# Patient Record
Sex: Female | Born: 1965 | Race: Black or African American | Hispanic: No | Marital: Single | Smoking: Current some day smoker
Health system: Southern US, Community
[De-identification: ages and names within clinical notes are randomized; demographics above are authoritative.]

## PROBLEM LIST (undated history)

## (undated) DIAGNOSIS — E119 Type 2 diabetes mellitus without complications: Secondary | ICD-10-CM

## (undated) DIAGNOSIS — I10 Essential (primary) hypertension: Secondary | ICD-10-CM

## (undated) DIAGNOSIS — E785 Hyperlipidemia, unspecified: Secondary | ICD-10-CM

## (undated) HISTORY — PX: ABDOMINAL HYSTERECTOMY: SHX81

---

## 2006-07-02 ENCOUNTER — Emergency Department: Payer: Self-pay

## 2006-07-23 ENCOUNTER — Ambulatory Visit: Payer: Self-pay | Admitting: Family Medicine

## 2008-02-02 ENCOUNTER — Ambulatory Visit: Payer: Self-pay | Admitting: Family Medicine

## 2009-02-09 ENCOUNTER — Ambulatory Visit: Payer: Self-pay | Admitting: Family Medicine

## 2010-04-18 ENCOUNTER — Ambulatory Visit: Payer: Self-pay | Admitting: Family Medicine

## 2010-10-25 ENCOUNTER — Emergency Department: Payer: Self-pay | Admitting: *Deleted

## 2011-11-06 ENCOUNTER — Ambulatory Visit: Payer: Self-pay | Admitting: Family Medicine

## 2012-12-20 ENCOUNTER — Emergency Department: Payer: Self-pay | Admitting: Emergency Medicine

## 2012-12-20 LAB — COMPREHENSIVE METABOLIC PANEL
Anion Gap: 6 — ABNORMAL LOW (ref 7–16)
BUN: 14 mg/dL (ref 7–18)
EGFR (African American): >60
SGOT(AST): 21 U/L (ref 15–37)
SGPT (ALT): 24 U/L (ref 12–78)

## 2012-12-20 LAB — CBC
HCT: 38.6 % (ref 35.0–47.0)
MCH: 29.4 pg (ref 26.0–34.0)

## 2012-12-20 LAB — TROPONIN I: Troponin-I: <0.02 ng/mL

## 2013-03-15 ENCOUNTER — Ambulatory Visit: Payer: Self-pay | Admitting: Family Medicine

## 2013-03-26 ENCOUNTER — Ambulatory Visit: Payer: Self-pay | Admitting: Family Medicine

## 2013-06-25 ENCOUNTER — Encounter: Payer: Self-pay | Admitting: Podiatry

## 2013-06-25 ENCOUNTER — Ambulatory Visit (INDEPENDENT_AMBULATORY_CARE_PROVIDER_SITE_OTHER): Payer: 59 | Admitting: Podiatry

## 2013-06-25 VITALS — BP 151/94 | HR 79 | Resp 16 | Ht 65.0 in | Wt 212.0 lb

## 2013-06-25 DIAGNOSIS — M722 Plantar fascial fibromatosis: Secondary | ICD-10-CM

## 2013-06-25 NOTE — Progress Notes (Signed)
Subjective:     Patient ID: Shannon Curtis, female   DOB: 10-Feb-1965, 48 y.o.   MRN: [id number]  HPI patient states my right foot the heel is still hurting me and I admit it's been hurting me since last year but I have not been able to get back and. He ended up doing surgery on the left one in his reminds me exactly of that I would rather just go ahead and get it fixed   Review of Systems     Objective:   Physical Exam Neurovascular status intact with digits well perfused and no significant change in health history. Upon palpation intense discomfort plantar fascia at the insertion of the tendon into the calcaneus is noted    Assessment:     Severe plantar fasciitis of at least one year duration right heel with left but at the same way and ended up requiring correction    Plan:     Reviewed condition and discussed options that we could consider including conservative and surgical patient states she is so tired of the pain she would rather have it fixed and at this time I did allow her to read consent form concerning medial band release of the plantar fascia right and reviewed all possible complications associated with the procedure such as this and the fact that she can develop other problems and just because 1 did so well no guarantee the other one will. Patient understands all of this wants surgery signed consent form and is scheduled for outpatient surgery in the next 2 weeks and at this time had air fracture walker dispensed. Instructed to call with any questions

## 2013-07-06 ENCOUNTER — Encounter: Payer: Self-pay | Admitting: Podiatry

## 2013-07-06 DIAGNOSIS — M722 Plantar fascial fibromatosis: Secondary | ICD-10-CM

## 2013-07-08 ENCOUNTER — Telehealth: Payer: Self-pay | Admitting: *Deleted

## 2013-07-08 NOTE — Progress Notes (Signed)
1. Endoscopic medial band rt heel   Dos 6.30.15

## 2013-07-08 NOTE — Telephone Encounter (Signed)
Left message at home number to call back. 

## 2013-07-12 ENCOUNTER — Encounter: Payer: 59 | Admitting: Podiatry

## 2013-07-15 ENCOUNTER — Ambulatory Visit (INDEPENDENT_AMBULATORY_CARE_PROVIDER_SITE_OTHER): Payer: 59 | Admitting: Podiatry

## 2013-07-15 ENCOUNTER — Encounter: Payer: Self-pay | Admitting: Podiatry

## 2013-07-15 VITALS — BP 139/79 | HR 82 | Resp 16

## 2013-07-15 DIAGNOSIS — M722 Plantar fascial fibromatosis: Secondary | ICD-10-CM

## 2013-07-15 DIAGNOSIS — Z9889 Other specified postprocedural states: Secondary | ICD-10-CM

## 2013-07-15 NOTE — Progress Notes (Signed)
She presents today 2 weeks status post endoscopic plantar fasciotomy right foot. She states that I had no pain whatsoever.  Objective: Vital signs are stable she is alert and oriented x3. Incision sites appear to be well-healed margins are well coapted. Sutures are intact. There is mild erythema mild drainage at the incision site but is it is very superficial.  Assessment: Status post endoscopic plantar fasciotomy right foot.  Plan: Discussed etiology pathology conservative therapies. Remove sutures today. Suggested that she start washing the foot thoroughly and soaking in Epsom salts and water which should dry up a small amount of drainage. She will washes signs and symptoms of infection and notify us that there are any these were explained to her today. She will continue use of the Cam Walker and a night splint and Dr.Regal will followup with her in 2 weeks.

## 2013-07-15 NOTE — Telephone Encounter (Signed)
Patient did not return telephone. Was in the office today to see Dr. Al CorpusHyatt for a post-op follow-up visit.

## 2013-07-20 ENCOUNTER — Other Ambulatory Visit: Payer: 59

## 2013-07-21 ENCOUNTER — Other Ambulatory Visit: Payer: 59

## 2013-07-26 ENCOUNTER — Encounter: Payer: Self-pay | Admitting: Podiatry

## 2013-07-30 ENCOUNTER — Ambulatory Visit (INDEPENDENT_AMBULATORY_CARE_PROVIDER_SITE_OTHER): Payer: 59 | Admitting: Podiatry

## 2013-07-30 ENCOUNTER — Encounter: Payer: Self-pay | Admitting: Podiatry

## 2013-07-30 ENCOUNTER — Ambulatory Visit: Payer: 59 | Admitting: Podiatry

## 2013-07-30 DIAGNOSIS — M722 Plantar fascial fibromatosis: Secondary | ICD-10-CM

## 2013-07-31 NOTE — Progress Notes (Signed)
Subjective:     Patient ID: Shannon Curtis, female   DOB: 17-Jun-1965, 48 y.o.   MRN: [id number]  HPI patient states that she's doing very good with her right heel and would like her stitches removed   Review of Systems     Objective:   Physical Exam Neurovascular status intact with well-healing surgical site right heel medial and lateral side and minimal discomfort in the plantar heel or arch    Assessment:     Improving very well from plantar fascial surgery right    Plan:     Advised on physical therapy of the area remove stitches apply dressings and instructed on continued boot usage with gradual reduction over the next 4 weeks. Reappoint as needed

## 2013-08-02 ENCOUNTER — Encounter: Payer: 59 | Admitting: Podiatry

## 2013-09-07 ENCOUNTER — Encounter: Payer: Self-pay | Admitting: Podiatry

## 2013-09-07 ENCOUNTER — Ambulatory Visit (INDEPENDENT_AMBULATORY_CARE_PROVIDER_SITE_OTHER): Payer: 59

## 2013-09-07 ENCOUNTER — Ambulatory Visit (INDEPENDENT_AMBULATORY_CARE_PROVIDER_SITE_OTHER): Payer: 59 | Admitting: Podiatry

## 2013-09-07 VITALS — BP 148/85 | HR 85 | Resp 16

## 2013-09-07 DIAGNOSIS — R609 Edema, unspecified: Secondary | ICD-10-CM

## 2013-09-07 DIAGNOSIS — M722 Plantar fascial fibromatosis: Secondary | ICD-10-CM

## 2013-09-07 MED ORDER — TRIAMCINOLONE ACETONIDE 10 MG/ML IJ SUSP
10.0000 mg | Freq: Once | INTRAMUSCULAR | Status: AC
  Administered 2013-09-07: 10 mg

## 2013-09-07 NOTE — Progress Notes (Signed)
Subjective:     Patient ID: Shannon Curtis, female   DOB: 10-19-1965, 47 y.o.   MRN: [id number]  HPI patient states that my heel is feeling pretty good but my arch is really hurting me and I have swelling in my ankle and foot right   Review of Systems     Objective:   Physical Exam Patient had endoscopic release of the plantar fascia and is noted to have well-healing incision sites but is noted to have discomfort into the mid arch area right with inflammation in the dorsum of the foot and the right ankle    Assessment:     Plantar inflammation where the tendon may have retracted  with fluid buildup and swelling which may be do to change in gait    Plan:     I did a careful mid arch injection 3 mg dexamethasone Kenalog 5 mg Xylocaine and applied Unna boot Ace wrap surgical shoe to try to reduce all swelling that is present

## 2013-09-21 ENCOUNTER — Encounter: Payer: Self-pay | Admitting: Podiatry

## 2013-09-21 ENCOUNTER — Ambulatory Visit (INDEPENDENT_AMBULATORY_CARE_PROVIDER_SITE_OTHER): Payer: 59 | Admitting: Podiatry

## 2013-09-21 VITALS — BP 127/78 | HR 80 | Resp 16

## 2013-09-21 DIAGNOSIS — M722 Plantar fascial fibromatosis: Secondary | ICD-10-CM

## 2013-09-21 MED ORDER — TRIAMCINOLONE ACETONIDE 10 MG/ML IJ SUSP
10.0000 mg | Freq: Once | INTRAMUSCULAR | Status: AC
  Administered 2013-09-21: 10 mg

## 2013-09-21 NOTE — Progress Notes (Signed)
Subjective:     Patient ID: Shannon Curtis, female   DOB: 11/24/1965, 48 y.o.   MRN: [id number]  HPI patient states that she still has a lot of pain when she tries to walk on her heel but when she's [name] down or sitting she has no pain   Review of Systems     Objective:   Physical Exam Neurovascular status intact negative Homans sign was noted with stitches intact on both medial and lateral side of the right heel    Assessment:     Incision site healing well with wound edges well coapted stitches in place right heel    Plan:     Stitches removed wound edges coapted well and discussed that it can still be quite tender 3 weeks and that her history of taking pain medication and obesity certainly a complicating factors and the pain she is experiencing. Continue boot usage and I did write her for Percocet today to try to control the pain she is experiencing during this short-term process and she will reappoint 2 weeks earlier if any issues should occur

## 2013-10-08 ENCOUNTER — Ambulatory Visit: Payer: 59 | Admitting: Podiatry

## 2013-10-15 ENCOUNTER — Ambulatory Visit: Payer: 59 | Admitting: Podiatry

## 2013-11-19 ENCOUNTER — Ambulatory Visit (INDEPENDENT_AMBULATORY_CARE_PROVIDER_SITE_OTHER): Payer: 59 | Admitting: Podiatry

## 2013-11-19 VITALS — BP 128/69 | HR 74 | Resp 16

## 2013-11-19 DIAGNOSIS — M722 Plantar fascial fibromatosis: Secondary | ICD-10-CM

## 2013-11-21 NOTE — Progress Notes (Signed)
Subjective:     Patient ID: Shannon Curtis, female   DOB: 1965/10/08, 48 y.o.   MRN: [id number]  HPIpatient states that my heel is feeling a lot better with minimal discomfort with ambulation or when getting up in the morning   Review of Systems     Objective:   Physical Exam Neurovascular status intact with significant diminishment of discomfort plantar heel at the insertion of the tendon into the calcaneus    Assessment:     Reviewed condition and at this time advised on the fact the plantar fasciitis is still present but reduced    Plan:     Reviewed physical therapy continued supportive shoe gear usage not going barefoot and reappoint for us to recheck if symptoms were to recur

## 2014-09-07 ENCOUNTER — Other Ambulatory Visit: Payer: Self-pay | Admitting: Family Medicine

## 2014-09-07 DIAGNOSIS — N63 Unspecified lump in unspecified breast: Secondary | ICD-10-CM

## 2014-09-21 ENCOUNTER — Other Ambulatory Visit: Payer: Self-pay

## 2014-09-21 ENCOUNTER — Ambulatory Visit: Payer: Self-pay

## 2014-09-26 ENCOUNTER — Other Ambulatory Visit: Payer: Self-pay | Admitting: Podiatry

## 2015-09-07 ENCOUNTER — Other Ambulatory Visit: Payer: Self-pay | Admitting: Family Medicine

## 2015-09-07 DIAGNOSIS — N63 Unspecified lump in unspecified breast: Secondary | ICD-10-CM

## 2015-10-04 ENCOUNTER — Other Ambulatory Visit: Payer: Self-pay | Admitting: Family Medicine

## 2015-10-04 ENCOUNTER — Encounter (HOSPITAL_COMMUNITY): Payer: Self-pay

## 2015-10-04 ENCOUNTER — Ambulatory Visit
Admission: RE | Admit: 2015-10-04 | Discharge: 2015-10-04 | Disposition: A | Discharge status: Home / Self Care | Payer: 59 | Source: Ambulatory Visit | Attending: Family Medicine | Admitting: Family Medicine

## 2015-10-04 DIAGNOSIS — N63 Unspecified lump in unspecified breast: Secondary | ICD-10-CM

## 2016-02-02 ENCOUNTER — Observation Stay
Admission: EM | Admit: 2016-02-02 | Discharge: 2016-02-02 | Disposition: A | Discharge status: Home / Self Care | Payer: 59 | Attending: Internal Medicine | Admitting: Internal Medicine

## 2016-02-02 ENCOUNTER — Encounter: Payer: Self-pay | Admitting: Urgent Care

## 2016-02-02 ENCOUNTER — Emergency Department: Payer: 59

## 2016-02-02 ENCOUNTER — Observation Stay: Payer: 59

## 2016-02-02 DIAGNOSIS — E119 Type 2 diabetes mellitus without complications: Secondary | ICD-10-CM | POA: Diagnosis not present

## 2016-02-02 DIAGNOSIS — I200 Unstable angina: Secondary | ICD-10-CM | POA: Diagnosis present

## 2016-02-02 DIAGNOSIS — Z7982 Long term (current) use of aspirin: Secondary | ICD-10-CM | POA: Insufficient documentation

## 2016-02-02 DIAGNOSIS — E785 Hyperlipidemia, unspecified: Secondary | ICD-10-CM | POA: Diagnosis not present

## 2016-02-02 DIAGNOSIS — R0789 Other chest pain: Principal | ICD-10-CM | POA: Insufficient documentation

## 2016-02-02 DIAGNOSIS — F17210 Nicotine dependence, cigarettes, uncomplicated: Secondary | ICD-10-CM | POA: Diagnosis not present

## 2016-02-02 DIAGNOSIS — R079 Chest pain, unspecified: Secondary | ICD-10-CM | POA: Diagnosis present

## 2016-02-02 DIAGNOSIS — I10 Essential (primary) hypertension: Secondary | ICD-10-CM | POA: Insufficient documentation

## 2016-02-02 DIAGNOSIS — Z794 Long term (current) use of insulin: Secondary | ICD-10-CM | POA: Diagnosis not present

## 2016-02-02 HISTORY — DX: Type 2 diabetes mellitus without complications: E11.9

## 2016-02-02 HISTORY — DX: Hyperlipidemia, unspecified: E78.5

## 2016-02-02 HISTORY — DX: Essential (primary) hypertension: I10

## 2016-02-02 LAB — NM MYOCAR MULTI W/SPECT W/WALL MOTION / EF
CHL CUP NUCLEAR SRS: 0
CHL CUP NUCLEAR SSS: 3
CHL CUP RESTING HR STRESS: 63 {beats}/min
CHL CUP STRESS STAGE 1 GRADE: 0 %
CHL CUP STRESS STAGE 1 HR: 59 {beats}/min
CHL CUP STRESS STAGE 2 GRADE: 0 %
CHL CUP STRESS STAGE 2 HR: 59 {beats}/min
CHL CUP STRESS STAGE 3 GRADE: 0 %
CHL CUP STRESS STAGE 4 GRADE: 0 %
CHL CUP STRESS STAGE 4 HR: 87 {beats}/min
CHL CUP STRESS STAGE 5 DBP: 61 mmHg
CHL CUP STRESS STAGE 5 SPEED: 0 mph
CSEPED: 1 min
CSEPEDS: 1 s
CSEPEW: 1 METS
CSEPHR: 51 %
CSEPPHR: 65 {beats}/min
CSEPPMHR: 38 %
LV dias vol: 70 mL (ref 46–106)
LVSYSVOL: 23 mL
MPHR: 171 {beats}/min
NUC STRESS TID: 1.41
SDS: 0
Stage 1 Speed: 0 mph
Stage 2 Speed: 0 mph
Stage 3 HR: 65 {beats}/min
Stage 3 Speed: 0 mph
Stage 4 Speed: 0 mph
Stage 5 Grade: 0 %
Stage 5 HR: 75 {beats}/min
Stage 5 SBP: 134 mmHg

## 2016-02-02 LAB — BASIC METABOLIC PANEL
Anion gap: 10 (ref 5–15)
BUN: 14 mg/dL (ref 6–20)
CALCIUM: 9.3 mg/dL (ref 8.9–10.3)
CO2: 25 mmol/L (ref 22–32)
CREATININE: 0.67 mg/dL (ref 0.44–1.00)
Chloride: 103 mmol/L (ref 101–111)
GFR calc non Af Amer: >60 mL/min (ref >60)
GLUCOSE: 257 mg/dL — AB (ref 65–99)
Potassium: 4 mmol/L (ref 3.5–5.1)
Sodium: 138 mmol/L (ref 135–145)

## 2016-02-02 LAB — CBC
HCT: 41.6 % (ref 35.0–47.0)
Hemoglobin: 14.5 g/dL (ref 12.0–16.0)
MCH: 29.4 pg (ref 26.0–34.0)
MCHC: 34.9 g/dL (ref 32.0–36.0)
MCV: 84.3 fL (ref 80.0–100.0)
PLATELETS: 222 10*3/uL (ref 150–440)
RBC: 4.94 MIL/uL (ref 3.80–5.20)
RDW: 12.6 % (ref 11.5–14.5)
WBC: 7.1 10*3/uL (ref 3.6–11.0)

## 2016-02-02 LAB — GLUCOSE, CAPILLARY
Glucose-Capillary: 236 mg/dL — ABNORMAL HIGH (ref 65–99)
Glucose-Capillary: 128 mg/dL — ABNORMAL HIGH (ref 65–99)
Glucose-Capillary: 120 mg/dL — ABNORMAL HIGH (ref 65–99)

## 2016-02-02 LAB — TROPONIN I
Troponin I: <0.03 ng/mL (ref <0.03)
Troponin I: <0.03 ng/mL (ref <0.03)

## 2016-02-02 LAB — TSH: TSH: 1.97 u[IU]/mL (ref 0.350–4.500)

## 2016-02-02 MED ORDER — PANTOPRAZOLE SODIUM 40 MG PO TBEC
40.0000 mg | DELAYED_RELEASE_TABLET | Freq: Every day | ORAL | Status: DC
  Administered 2016-02-02: 40 mg via ORAL
  Filled 2016-02-02: qty 1

## 2016-02-02 MED ORDER — ONDANSETRON HCL 4 MG PO TABS: 4.0000 mg | ORAL_TABLET | Freq: Four times a day (QID) | ORAL | Status: DC | PRN

## 2016-02-02 MED ORDER — ONDANSETRON HCL 4 MG/2ML IJ SOLN
4.0000 mg | Freq: Four times a day (QID) | INTRAMUSCULAR | Status: DC | PRN
  Administered 2016-02-02: 4 mg via INTRAVENOUS

## 2016-02-02 MED ORDER — INSULIN ASPART 100 UNIT/ML ~~LOC~~ SOLN: 0.0000 [IU] | Freq: Every day | SUBCUTANEOUS | Status: DC

## 2016-02-02 MED ORDER — DOCUSATE SODIUM 100 MG PO CAPS
100.0000 mg | ORAL_CAPSULE | Freq: Two times a day (BID) | ORAL | Status: DC
  Administered 2016-02-02: 100 mg via ORAL
  Filled 2016-02-02: qty 1

## 2016-02-02 MED ORDER — REGADENOSON 0.4 MG/5ML IV SOLN
0.4000 mg | Freq: Once | INTRAVENOUS | Status: AC
  Administered 2016-02-02: 0.4 mg via INTRAVENOUS
  Filled 2016-02-02: qty 5

## 2016-02-02 MED ORDER — INSULIN ASPART 100 UNIT/ML ~~LOC~~ SOLN
0.0000 [IU] | Freq: Four times a day (QID) | SUBCUTANEOUS | Status: DC
  Administered 2016-02-02: 5 [IU] via SUBCUTANEOUS
  Filled 2016-02-02: qty 5

## 2016-02-02 MED ORDER — LISINOPRIL 20 MG PO TABS
40.0000 mg | ORAL_TABLET | Freq: Every day | ORAL | Status: DC
  Administered 2016-02-02: 40 mg via ORAL
  Filled 2016-02-02: qty 2

## 2016-02-02 MED ORDER — ACETAMINOPHEN 650 MG RE SUPP: 650.0000 mg | Freq: Four times a day (QID) | RECTAL | Status: DC | PRN

## 2016-02-02 MED ORDER — ROSUVASTATIN CALCIUM 20 MG PO TABS
20.0000 mg | ORAL_TABLET | Freq: Every day | ORAL | Status: DC
  Administered 2016-02-02: 20 mg via ORAL
  Filled 2016-02-02: qty 1

## 2016-02-02 MED ORDER — ASPIRIN 81 MG PO CHEW
324.0000 mg | CHEWABLE_TABLET | Freq: Once | ORAL | Status: AC
  Administered 2016-02-02: 324 mg via ORAL
  Filled 2016-02-02: qty 4

## 2016-02-02 MED ORDER — ONDANSETRON HCL 4 MG/2ML IJ SOLN
INTRAMUSCULAR | Status: AC
  Filled 2016-02-02: qty 2

## 2016-02-02 MED ORDER — NITROGLYCERIN 2 % TD OINT
0.5000 [in_us] | TOPICAL_OINTMENT | Freq: Once | TRANSDERMAL | Status: AC
  Administered 2016-02-02: 0.5 [in_us] via TOPICAL
  Filled 2016-02-02: qty 1

## 2016-02-02 MED ORDER — TECHNETIUM TC 99M TETROFOSMIN IV KIT
31.8700 | PACK | Freq: Once | INTRAVENOUS | Status: AC | PRN
  Administered 2016-02-02: 31.87 via INTRAVENOUS

## 2016-02-02 MED ORDER — ENOXAPARIN SODIUM 40 MG/0.4ML ~~LOC~~ SOLN: 40.0000 mg | SUBCUTANEOUS | Status: DC

## 2016-02-02 MED ORDER — ACETAMINOPHEN 325 MG PO TABS
650.0000 mg | ORAL_TABLET | Freq: Four times a day (QID) | ORAL | Status: DC | PRN
  Administered 2016-02-02: 650 mg via ORAL
  Filled 2016-02-02: qty 2

## 2016-02-02 MED ORDER — SODIUM CHLORIDE 0.9% FLUSH
3.0000 mL | Freq: Two times a day (BID) | INTRAVENOUS | Status: DC
  Administered 2016-02-02: 3 mL via INTRAVENOUS

## 2016-02-02 MED ORDER — TECHNETIUM TC 99M TETROFOSMIN IV KIT
12.7200 | PACK | Freq: Once | INTRAVENOUS | Status: AC | PRN
  Administered 2016-02-02: 12.72 via INTRAVENOUS

## 2016-02-02 MED ORDER — ASPIRIN EC 81 MG PO TBEC
81.0000 mg | DELAYED_RELEASE_TABLET | Freq: Every day | ORAL | Status: DC
  Administered 2016-02-02: 81 mg via ORAL
  Filled 2016-02-02: qty 1

## 2016-02-02 NOTE — H&P (Signed)
Shannon Curtis is an 51 y.o. female.   Chief Complaint: Chest pain HPI: The patient with past medical history of diabetes, hypertension and hyperlipidemia presents to emergency department complaining of chest pain. The patient's pain awoke her from sleep this evening. It was substernal and radiated down her right arm. She works third shift and proceeded to go to work but her pain worsened. The pain was associated with nausea as well as diaphoresis. It is dull in character and 8 out of 10 in severity. Following application of Nitropaste her pain decreased to 3 out of 10 in severity. Due to her risk factors for heart disease and ongoing chest pain the emergency department staff called the hospitalist service for admission.  Past Medical History:  Diagnosis Date  . Diabetes mellitus without complication ([place])   . Hyperlipemia   . Hypertension     Past Surgical History:  Procedure Laterality Date  . ABDOMINAL HYSTERECTOMY    . CESAREAN SECTION      Family History  Problem Relation Age of Onset  . Breast cancer Neg Hx    Social History:  reports that she has been smoking.  She has never used smokeless tobacco. She reports that she does not drink alcohol. Her drug history is not on file.  Allergies:  Allergies  Allergen Reactions  . Atorvastatin Other (See Comments)  . Statins Other (See Comments)    Leg cramps    Medications Prior to Admission  Medication Sig Dispense Refill  . aspirin EC 81 MG tablet Take 81 mg by mouth daily.    Marland Kitchen glimepiride (AMARYL) 4 MG tablet Take 4 mg by mouth 2 (two) times daily.     . Liraglutide (VICTOZA [place]) Inject 1.8 mg into the skin daily.     Marland Kitchen lisinopril (PRINIVIL,ZESTRIL) 40 MG tablet Take 40 mg by mouth daily.     Marland Kitchen omeprazole (PRILOSEC) 20 MG capsule Take 20 mg by mouth daily.    . rosuvastatin (CRESTOR) 20 MG tablet Take 20 mg by mouth daily.      Results for orders placed or performed during the hospital encounter of 02/02/16 (from the past 48  hour(s))  Basic metabolic panel     Status: Abnormal   Collection Time: 02/02/16  1:59 AM  Result Value Ref Range   Sodium 138 135 - 145 mmol/L   Potassium 4.0 3.5 - 5.1 mmol/L   Chloride 103 101 - 111 mmol/L   CO2 25 22 - 32 mmol/L   Glucose, Bld 257 (H) 65 - 99 mg/dL   BUN 14 6 - 20 mg/dL   Creatinine, Ser 0.67 0.44 - 1.00 mg/dL   Calcium 9.3 8.9 - 10.3 mg/dL   GFR calc non Af Amer >60 >60 mL/min   GFR calc Af Amer >60 >60 mL/min    Comment: (NOTE) The eGFR has been calculated using the CKD EPI equation. This calculation has not been validated in all clinical situations. eGFR's persistently <60 mL/min signify possible Chronic Kidney Disease.    Anion gap 10 5 - 15  CBC     Status: None   Collection Time: 02/02/16  1:59 AM  Result Value Ref Range   WBC 7.1 3.6 - 11.0 K/uL   RBC 4.94 3.80 - 5.20 MIL/uL   Hemoglobin 14.5 12.0 - 16.0 g/dL   HCT 41.6 35.0 - 47.0 %   MCV 84.3 80.0 - 100.0 fL   MCH 29.4 26.0 - 34.0 pg   MCHC 34.9 32.0 - 36.0 g/dL  RDW 12.6 11.5 - 14.5 %   Platelets 222 150 - 440 K/uL  Troponin I     Status: None   Collection Time: 02/02/16  1:59 AM  Result Value Ref Range   Troponin I <0.03 <0.03 ng/mL  Glucose, capillary     Status: Abnormal   Collection Time: 02/02/16  5:37 AM  Result Value Ref Range   Glucose-Capillary 236 (H) 65 - 99 mg/dL   Comment 1 Notify RN    Comment 2 Document in Chart   TSH     Status: None   Collection Time: 02/02/16  5:46 AM  Result Value Ref Range   TSH 1.970 0.350 - 4.500 uIU/mL    Comment: Performed by a 3rd Generation assay with a functional sensitivity of <=0.01 uIU/mL.  Troponin I     Status: None   Collection Time: 02/02/16  5:46 AM  Result Value Ref Range   Troponin I <0.03 <0.03 ng/mL  Glucose, capillary     Status: Abnormal   Collection Time: 02/02/16  7:53 AM  Result Value Ref Range   Glucose-Capillary 128 (H) 65 - 99 mg/dL   Dg Chest 2 View  Result Date: 02/02/2016 CLINICAL DATA:  Initial evaluation for  acute chest pain, shortness of breath. EXAM: CHEST  2 VIEW COMPARISON:  Prior radiograph from 07/02/2006. FINDINGS: The cardiac and mediastinal silhouettes are stable in size and contour, and remain within normal limits. The lungs are normally inflated. No airspace consolidation, pleural effusion, or pulmonary edema is identified. There is no pneumothorax. No acute osseous abnormality identified. IMPRESSION: No active cardiopulmonary disease. Electronically Signed   By: Jeannine Boga M.D.   On: 02/02/2016 02:32    Review of Systems  Constitutional: Positive for diaphoresis. Negative for chills and fever.  HENT: Negative for sore throat and tinnitus.   Eyes: Negative for blurred vision and redness.  Respiratory: Positive for shortness of breath. Negative for cough.   Cardiovascular: Positive for chest pain. Negative for palpitations, orthopnea and PND.  Gastrointestinal: Positive for nausea. Negative for abdominal pain, diarrhea and vomiting.  Genitourinary: Negative for dysuria, frequency and urgency.  Musculoskeletal: Negative for joint pain and myalgias.  Skin: Negative for rash.       No lesions  Neurological: Negative for speech change, focal weakness and weakness.  Endo/Heme/Allergies: Does not bruise/bleed easily.       No temperature intolerance  Psychiatric/Behavioral: Negative for depression and suicidal ideas.    Blood pressure 120/66, pulse 74, temperature 98.5 F (36.9 C), temperature source Oral, resp. rate 18, height '5\' 4"'  (1.626 m), weight 86.8 kg (191 lb 4.8 oz), SpO2 99 %. Physical Exam  Vitals reviewed. Constitutional: She is oriented to person, place, and time. She appears well-developed and well-nourished.  HENT:  Head: Normocephalic and atraumatic.  Mouth/Throat: Oropharynx is clear and moist.  Eyes: Conjunctivae and EOM are normal. Pupils are equal, round, and reactive to light. No scleral icterus.  Neck: Normal range of motion. Neck supple. No tracheal  deviation present. No thyromegaly present.  Cardiovascular: Normal rate, regular rhythm and normal heart sounds.  Exam reveals no gallop and no friction rub.   No murmur heard. Respiratory: Effort normal and breath sounds normal.  GI: Soft. Bowel sounds are normal. She exhibits no distension. There is no tenderness.  Genitourinary:  Genitourinary Comments: Deferred  Musculoskeletal: Normal range of motion. She exhibits no edema.  Lymphadenopathy:    She has no cervical adenopathy.  Neurological: She is alert and oriented to  person, place, and time. No cranial nerve deficit. She exhibits normal muscle tone.  Skin: Skin is warm and dry. No rash noted. No erythema.  Psychiatric: She has a normal mood and affect. Her behavior is normal. Judgment and thought content normal.     Assessment/Plan This is a 51 year old female admitted for chest pain. 1. Chest pain: Atypical induration as well as symptomology, however the patient has diabetes which can alter the presentation of ischemic chest pain. Currently troponin and EKG showed no signs or symptoms of myocardial ischemia. Follow cardiac biomarkers and monitor telemetry. Consult cardiology. 2. Diabetes mellitus type 2: Hold Victoza and oral hypoglycemic agents; sliding scale insulin while hospitalized 3. Essential hypertension: Controlled; Continue lisinopril 4. Hyperlipidemia: Continue statin therapy 5. DVT prophylaxis: Lovenox 6. GI prophylaxis: Pantoprazole per home regimen The patient is a full code. Time spent on admission orders and patient care approximately 45 minutes  Harrie Foreman, MD 02/02/2016, 8:11 AM

## 2016-02-02 NOTE — ED Triage Notes (Signed)
Patient presents with c/o RIGHT sided CP with (+) radiation into RUE. She reports that her arm is "heavy". (+) nausea and SOB; denies diaphoresis.

## 2016-02-02 NOTE — Progress Notes (Signed)
Patient arrived to 2A Room 259. Patient denies pain and all questions answered. Patient oriented to unit and Fall Safety Plan signed. Skin assessment completed with Crystal RN and skin intact. A&Ox4, VSS, and NSR on verified tele-box #[id number]. Nursing staff will continue to monitor for any changes in patient status. Lamonte RicherKara A Linzy Darling, RN

## 2016-02-02 NOTE — ED Notes (Signed)
ED Provider at bedside. 

## 2016-02-02 NOTE — ED Provider Notes (Signed)
[facility] Emergency Department Provider Note   ____________________________________________   First MD Initiated Contact with Patient 02/02/16 [phone]     (approximate)  I have reviewed the triage vital signs and the nursing notes.   HISTORY  Chief Complaint Chest Pain and Arm Pain    HPI Shannon Curtis is a 51 y.o. female who presents to the ED from work with a chief complaint of chest pain. Patient has a history of diabetes and hypertension who works third shift; awoke approximately 7:30 PM with right sided chest heaviness radiating into her right arm.While at work at [employer], patient had an intense episode of chest heaviness associated with diaphoresis, nausea and shortness of breath. Describes waxing/waning chest pressure. Denies recent fever, chills, abdominal pain, vomiting, diarrhea. Denies recent travel or trauma. Nothing makes her symptoms better or worse.   Past Medical History:  Diagnosis Date  . Diabetes mellitus without complication (HCC)   . Hyperlipemia   . Hypertension     There are no active problems to display for this patient.   Past Surgical History:  Procedure Laterality Date  . ABDOMINAL HYSTERECTOMY    . CESAREAN SECTION      Prior to Admission medications   Medication Sig Start Date End Date Taking? Authorizing Provider  glimepiride (AMARYL) 4 MG tablet Take 4 mg by mouth 2 (two) times daily.    Yes Historical Provider, MD  Liraglutide (VICTOZA [place]) Inject 1.8 mg into the skin daily.    Yes Historical Provider, MD  lisinopril (PRINIVIL,ZESTRIL) 40 MG tablet Take 40 mg by mouth daily.    Yes Historical Provider, MD  omeprazole (PRILOSEC) 20 MG capsule Take 20 mg by mouth daily.   Yes Historical Provider, MD  rosuvastatin (CRESTOR) 20 MG tablet Take 20 mg by mouth daily.   Yes Historical Provider, MD    Allergies Atorvastatin and Statins  Family History  Problem Relation Age of Onset  . Breast cancer Neg Hx      Social History Social History  Substance Use Topics  . Smoking status: Current Some Day Smoker  . Smokeless tobacco: Never Used  . Alcohol use No    Review of Systems  Constitutional: No fever/chills. Eyes: No visual changes. ENT: No sore throat. Cardiovascular: Positive for chest pain. Respiratory: Positive for shortness of breath. Gastrointestinal: No abdominal pain.  Positive for nausea, no vomiting.  No diarrhea.  No constipation. Genitourinary: Negative for dysuria. Musculoskeletal: Negative for back pain. Skin: Negative for rash. Neurological: Negative for headaches, focal weakness or numbness.  10-point ROS otherwise negative.  ____________________________________________   PHYSICAL EXAM:  VITAL SIGNS: ED Triage Vitals [02/02/16 0158]  Enc Vitals Group     BP (!) 169/97     Pulse Rate 91     Resp 18     Temp 98.1 F (36.7 C)     Temp Source Oral     SpO2 97 %     Weight 197 lb (89.4 kg)     Height 5\' 4"  (1.626 m)     Head Circumference      Peak Flow      Pain Score 7     Pain Loc      Pain Edu?      Excl. in GC?      Constitutional: Alert and oriented. Well appearing and in no acute distress. Eyes: Conjunctivae are normal. PERRL. EOMI. Head: Atraumatic. Nose: No congestion/rhinnorhea. Mouth/Throat: Mucous membranes are moist.  Oropharynx non-erythematous. Neck: No  stridor.   Cardiovascular: Normal rate, regular rhythm. Grossly normal heart sounds.  Good peripheral circulation. Respiratory: Normal respiratory effort.  No retractions. Lungs CTAB. Gastrointestinal: Soft and nontender. No distention. No abdominal bruits. No CVA tenderness. Musculoskeletal: No lower extremity tenderness nor edema.  No joint effusions. Neurologic:  Normal speech and language. No gross focal neurologic deficits are appreciated. No gait instability. Skin:  Skin is warm, dry and intact. No rash noted. Psychiatric: Mood and affect are normal. Speech and behavior are  normal.  ____________________________________________   LABS (all labs ordered are listed, but only abnormal results are displayed)  Labs Reviewed  BASIC METABOLIC PANEL - Abnormal; Notable for the following:       Result Value   Glucose, Bld 257 (*)    All other components within normal limits  CBC  TROPONIN I  TROPONIN I   ____________________________________________  EKG  ED ECG REPORT I, SUNG,JADE J, the attending physician, personally viewed and interpreted this ECG.   Date: 02/02/2016  EKG Time: 0158  Rate: 90  Rhythm: normal EKG, normal sinus rhythm  Axis: Normal  Intervals:none  ST&T Change: Nonspecific  ____________________________________________  RADIOLOGY  Chest 2 view interpreted per Dr. Phill MyronMcClintock: No active cardiopulmonary disease. ____________________________________________   PROCEDURES  Procedure(s) performed: None  Procedures  Critical Care performed: No  ____________________________________________   INITIAL IMPRESSION / ASSESSMENT AND PLAN / ED COURSE  Pertinent labs & imaging results that were available during my care of the patient were reviewed by me and considered in my medical decision making (see chart for details).  51 year old female with diabetes and hypertension who presents with waxing/waning chest pain concerning for unstable angina. Initial EKG and troponin are unremarkable. Will administer aspirin, nitroglycerin paste and discuss with hospitalist to evaluate patient in the emergency department for admission.      ____________________________________________   FINAL CLINICAL IMPRESSION(S) / ED DIAGNOSES  Final diagnoses:  Chest pain, unspecified type  Unstable angina (HCC)      NEW MEDICATIONS STARTED DURING THIS VISIT:  New Prescriptions   No medications on file     Note:  This document was prepared using Dragon voice recognition software and may include unintentional dictation errors.    Irean HongJade J  Sung, MD 02/02/16 [id number]

## 2016-02-02 NOTE — Progress Notes (Signed)
Patient received to floor from nuclear medicine post stress test.Denies chest pain

## 2016-02-02 NOTE — ED Notes (Signed)
Patient was having dry heaves. Did spit up some saliva. Gave patient prn zofran and warm blanket.

## 2016-02-02 NOTE — Progress Notes (Signed)
Patient discharged home as ordered,instructions explained and well understood,follow up appointments made,hemodynamically stable,escorted by family and staff member via wheel chair

## 2016-02-02 NOTE — Consult Note (Signed)
[facility]  CARDIOLOGY CONSULT NOTE  Patient ID: Shannon Curtis MRN: [id number] DOB/AGE: [birth year] 51 y.o.  Admit date: 02/02/2016 Referring Physician Dr. Sherryll BurgerShah Primary Physician   Primary Cardiologist   Reason for Consultation chest pain  HPI: Pt is a 51 yo female with history of diabetes, hypertension and hyperlipidemia who was admitted after presenting to er with midsternal chest pain associated with nausea and diaphoresis. EKG was unremarkable and she ruled out for an mi. Pain has resolved.  cxr revealed no active cardiopulmonary disease.  Review of Systems  Constitutional: Positive for diaphoresis.  HENT: Negative.   Eyes: Negative.   Respiratory: Positive for shortness of breath.   Cardiovascular: Positive for chest pain.  Gastrointestinal: Negative.   Genitourinary: Negative.   Musculoskeletal: Negative.   Skin: Negative.   Neurological: Negative.   Endo/Heme/Allergies: Negative.   Psychiatric/Behavioral: Negative.     Past Medical History:  Diagnosis Date  . Diabetes mellitus without complication (HCC)   . Hyperlipemia   . Hypertension     Family History  Problem Relation Age of Onset  . Breast cancer Neg Hx     Social History   Social History  . Marital status: Single    Spouse name: N/A  . Number of children: N/A  . Years of education: N/A   Occupational History  . Not on file.   Social History Main Topics  . Smoking status: Current Some Day Smoker  . Smokeless tobacco: Never Used  . Alcohol use No  . Drug use: Unknown  . Sexual activity: Not on file   Other Topics Concern  . Not on file   Social History Narrative  . No narrative on file    Past Surgical History:  Procedure Laterality Date  . ABDOMINAL HYSTERECTOMY    . CESAREAN SECTION       Prescriptions Prior to Admission  Medication Sig Dispense Refill Last Dose  . aspirin EC 81 MG tablet Take 81 mg by mouth daily.   02/01/2016 at Unknown time   . glimepiride (AMARYL) 4 MG tablet Take 4 mg by mouth 2 (two) times daily.    02/01/2016 at Unknown time  . Liraglutide (VICTOZA [place]) Inject 1.8 mg into the skin daily.    02/01/2016 at Unknown time  . lisinopril (PRINIVIL,ZESTRIL) 40 MG tablet Take 40 mg by mouth daily.    02/01/2016 at Unknown time  . omeprazole (PRILOSEC) 20 MG capsule Take 20 mg by mouth daily.   02/01/2016 at Unknown time  . rosuvastatin (CRESTOR) 20 MG tablet Take 20 mg by mouth daily.   02/01/2016 at Unknown time    Physical Exam: Blood pressure 120/66, pulse 74, temperature 98.5 F (36.9 C), temperature source Oral, resp. rate 18, height 5\' 4"  (1.626 m), weight 191 lb 4.8 oz (86.8 kg), SpO2 99 %.   Wt Readings from Last 1 Encounters:  02/02/16 191 lb 4.8 oz (86.8 kg)     General appearance: alert and cooperative Back: symmetric, no curvature. ROM normal. No CVA tenderness. Resp: clear to auscultation bilaterally Cardio: regular rate and rhythm GI: soft, non-tender; bowel sounds normal; no masses,  no organomegaly Extremities: extremities normal, atraumatic, no cyanosis or edema Neurologic: Grossly normal  Labs:   Lab Results  Component Value Date   WBC 7.1 02/02/2016   HGB 14.5 02/02/2016   HCT 41.6 02/02/2016   MCV 84.3 02/02/2016   PLT 222 02/02/2016    Recent Labs Lab 02/02/16 0159  NA 138  K 4.0  CL 103  CO2 25  BUN 14  CREATININE 0.67  CALCIUM 9.3  GLUCOSE 257*   Lab Results  Component Value Date   TROPONINI <0.03 02/02/2016        ASSESSMENT AND PLAN:  Pt with history of dm, hypertension and hyperlipidemia who was admitted with chest pain and ruled out for mi. Etiology unclear. Funcitonal study this am. Further recs after stres test.  Signed: Dalia Heading MD, [facility] 02/02/2016, 11:03 AM

## 2016-02-02 NOTE — ED Notes (Signed)
Attempted IV x 1 unsuccessful.

## 2016-02-02 NOTE — Discharge Instructions (Signed)
Chest Wall Pain °Introduction °Chest wall pain is pain in or around the bones and muscles of your chest. Sometimes, an injury causes this pain. Sometimes, the cause may not be known. This pain may take several weeks or longer to get better. °Follow these instructions at home: °Pay attention to any changes in your symptoms. Take these actions to help with your pain: °· Rest as told by your doctor. °· Avoid activities that cause pain. Try not to use your chest, belly (abdominal), or side muscles to lift heavy things. °· If directed, apply ice to the painful area: °¨ Put ice in a plastic bag. °¨ Place a towel between your skin and the bag. °¨ Leave the ice on for 20 minutes, 2-3 times per day. °· Take over-the-counter and prescription medicines only as told by your doctor. °· Do not use tobacco products, including cigarettes, chewing tobacco, and e-cigarettes. If you need help quitting, ask your doctor. °· Keep all follow-up visits as told by your doctor. This is important. °Contact a doctor if: °· You have a fever. °· Your chest pain gets worse. °· You have new symptoms. °Get help right away if: °· You feel sick to your stomach (nauseous) or you throw up (vomit). °· You feel sweaty or light-headed. °· You have a cough with phlegm (sputum) or you cough up blood. °· You are short of breath. °This information is not intended to replace advice given to you by your health care provider. Make sure you discuss any questions you have with your health care provider. °Document Released: 06/12/2007 Document Revised: 06/01/2015 Document Reviewed: 03/21/2014 °© 2017 Elsevier ° °

## 2016-02-02 NOTE — Progress Notes (Signed)
Patient transported off the floor to Nuclear Medicine for  lexi scan.

## 2016-02-03 LAB — HEMOGLOBIN A1C
Hgb A1c MFr Bld: 11.4 % — ABNORMAL HIGH (ref 4.8–5.6)
Mean Plasma Glucose: 280 mg/dL

## 2016-02-05 NOTE — Discharge Summary (Signed)
[facility] [place] [facility]   PATIENT NAME: Shannon Curtis    MR#:  [id number]  DATE OF BIRTH:  [birth year]  DATE OF ADMISSION:  02/02/2016   ADMITTING PHYSICIAN: Arnaldo NatalMichael S Diamond, MD  DATE OF DISCHARGE: 02/02/2016  3:32 PM  PRIMARY CARE PHYSICIAN: Marisue IvanLINTHAVONG, KANHKA, MD   ADMISSION DIAGNOSIS:  Unstable angina (HCC) [I20.0] Chest pain, unspecified type [R07.9] DISCHARGE DIAGNOSIS:  Active Problems:   Chest pain  SECONDARY DIAGNOSIS:   Past Medical History:  Diagnosis Date  . Diabetes mellitus without complication (HCC)   . Hyperlipemia   . Hypertension    HOSPITAL COURSE:  [age] y f with past medical history of diabetes, hypertension and hyperlipidemia admitted for chest pain.   * Chest pain: ruled out serial neg troponins, neg myoview. Could be due to silent reflux. Recommended to take daily PPI trial. No further chest pain while in the Hospital. DISCHARGE CONDITIONS:  stable CONSULTS OBTAINED:  Treatment Team:  Dalia HeadingKenneth A Fath, MD DRUG ALLERGIES:   Allergies  Allergen Reactions  . Atorvastatin Other (See Comments)  . Statins Other (See Comments)    Leg cramps   DISCHARGE MEDICATIONS:   Allergies as of 02/02/2016      Reactions   Atorvastatin Other (See Comments)   Statins Other (See Comments)   Leg cramps      Medication List    TAKE these medications   aspirin EC 81 MG tablet Take 81 mg by mouth daily.   glimepiride 4 MG tablet Commonly known as:  AMARYL Take 4 mg by mouth 2 (two) times daily.   lisinopril 40 MG tablet Commonly known as:  PRINIVIL,ZESTRIL Take 40 mg by mouth daily.   omeprazole 20 MG capsule Commonly known as:  PRILOSEC Take 20 mg by mouth daily.   rosuvastatin 20 MG tablet Commonly known as:  CRESTOR Take 20 mg by mouth daily.   VICTOZA [place] Inject 1.8 mg into the skin daily.      DISCHARGE INSTRUCTIONS:   DIET:  Regular diet DISCHARGE CONDITION:  Good ACTIVITY:  Activity as  tolerated OXYGEN:  Home Oxygen: No.  Oxygen Delivery: room air DISCHARGE LOCATION:  home   If you experience worsening of your admission symptoms, develop shortness of breath, life threatening emergency, suicidal or homicidal thoughts you must seek medical attention immediately by calling 911 or calling your MD immediately  if symptoms less severe.  You Must read complete instructions/literature along with all the possible adverse reactions/side effects for all the Medicines you take and that have been prescribed to you. Take any new Medicines after you have completely understood and accpet all the possible adverse reactions/side effects.   Please note  You were cared for by a hospitalist during your hospital stay. If you have any questions about your discharge medications or the care you received while you were in the hospital after you are discharged, you can call the unit and asked to speak with the hospitalist on call if the hospitalist that took care of you is not available. Once you are discharged, your primary care physician will handle any further medical issues. Please note that NO REFILLS for any discharge medications will be authorized once you are discharged, as it is imperative that you return to your primary care physician (or establish a relationship with a primary care physician if you do not have one) for your aftercare needs so that they can reassess your need for medications and monitor your lab values.  On the day of Discharge:  VITAL SIGNS:  Blood pressure (!) 171/87, pulse (!) 54, temperature 98.3 F (36.8 C), temperature source Oral, resp. rate 14, height 5\' 4"  (1.626 m), weight 86.8 kg (191 lb 4.8 oz), SpO2 99 %. PHYSICAL EXAMINATION:  GENERAL:  51 y.o.-year-old patient lying in the bed with no acute distress.  EYES: Pupils equal, round, reactive to light and accommodation. No scleral icterus. Extraocular muscles intact.  HEENT: Head atraumatic, normocephalic.  Oropharynx and nasopharynx clear.  NECK:  Supple, no jugular venous distention. No thyroid enlargement, no tenderness.  LUNGS: Normal breath sounds bilaterally, no wheezing, rales,rhonchi or crepitation. No use of accessory muscles of respiration.  CARDIOVASCULAR: S1, S2 normal. No murmurs, rubs, or gallops.  ABDOMEN: Soft, non-tender, non-distended. Bowel sounds present. No organomegaly or mass.  EXTREMITIES: No pedal edema, cyanosis, or clubbing.  NEUROLOGIC: Cranial nerves II through XII are intact. Muscle strength 5/5 in all extremities. Sensation intact. Gait not checked.  PSYCHIATRIC: The patient is alert and oriented x 3.  SKIN: No obvious rash, lesion, or ulcer.  DATA REVIEW:   CBC  Recent Labs Lab 02/02/16 0159  WBC 7.1  HGB 14.5  HCT 41.6  PLT 222    Chemistries   Recent Labs Lab 02/02/16 0159  NA 138  K 4.0  CL 103  CO2 25  GLUCOSE 257*  BUN 14  CREATININE 0.67  CALCIUM 9.3     Follow-up Information    Marisue Ivan, MD. Go on 02/08/2016.   Specialty:  Family Medicine Why:  Appointment Time: 10:30am Contact information: [address] [facility] [place] [address] [phone]        Dalia Heading, MD. Go on 02/16/2016.   Specialty:  Cardiology Why:  Appointment Time: 10:00am Contact information: [address] [facility] - CARDIOLOGY [place] [address] [phone]           Management plans discussed with the patient, family and they are in agreement.  CODE STATUS:  Code Status History    Date Active Date Inactive Code Status Order ID Comments User Context   02/02/2016  5:24 AM 02/02/2016  6:37 PM Full Code [id number]  Arnaldo Natal, MD Inpatient      TOTAL TIME TAKING CARE OF THIS PATIENT: 45 minutes.    Delfino Lovett M.D on 02/05/2016 at 2:53 PM  Between 7am to 6pm - Pager - [phone]  After 6pm go to [email or web address] - Social research officer, government  [facility]   Office  [phone]  CC: Primary care physician; Marisue Ivan, MD   Note: This dictation was prepared with Dragon dictation along with smaller phrase technology. Any transcriptional errors that result from this process are unintentional.

## 2016-10-21 ENCOUNTER — Other Ambulatory Visit: Payer: Self-pay | Admitting: Family Medicine

## 2016-10-21 DIAGNOSIS — Z1231 Encounter for screening mammogram for malignant neoplasm of breast: Secondary | ICD-10-CM

## 2016-11-05 ENCOUNTER — Ambulatory Visit
Admission: RE | Admit: 2016-11-05 | Discharge: 2016-11-05 | Disposition: A | Discharge status: Home / Self Care | Payer: 59 | Source: Ambulatory Visit | Attending: Family Medicine | Admitting: Family Medicine

## 2016-11-05 DIAGNOSIS — R928 Other abnormal and inconclusive findings on diagnostic imaging of breast: Secondary | ICD-10-CM | POA: Insufficient documentation

## 2016-11-05 DIAGNOSIS — N6489 Other specified disorders of breast: Secondary | ICD-10-CM | POA: Diagnosis not present

## 2016-11-05 DIAGNOSIS — Z1231 Encounter for screening mammogram for malignant neoplasm of breast: Secondary | ICD-10-CM | POA: Insufficient documentation

## 2016-11-07 ENCOUNTER — Other Ambulatory Visit: Payer: Self-pay | Admitting: Family Medicine

## 2016-11-07 DIAGNOSIS — N6489 Other specified disorders of breast: Secondary | ICD-10-CM

## 2016-11-07 DIAGNOSIS — R928 Other abnormal and inconclusive findings on diagnostic imaging of breast: Secondary | ICD-10-CM

## 2016-11-15 ENCOUNTER — Ambulatory Visit
Admission: RE | Admit: 2016-11-15 | Discharge: 2016-11-15 | Disposition: A | Discharge status: Home / Self Care | Payer: 59 | Source: Ambulatory Visit | Attending: Family Medicine | Admitting: Family Medicine

## 2016-11-15 DIAGNOSIS — R928 Other abnormal and inconclusive findings on diagnostic imaging of breast: Secondary | ICD-10-CM

## 2016-11-15 DIAGNOSIS — N6489 Other specified disorders of breast: Secondary | ICD-10-CM | POA: Diagnosis present

## 2017-11-12 ENCOUNTER — Other Ambulatory Visit: Payer: Self-pay | Admitting: Family Medicine

## 2017-11-12 DIAGNOSIS — Z1231 Encounter for screening mammogram for malignant neoplasm of breast: Secondary | ICD-10-CM

## 2017-12-15 ENCOUNTER — Ambulatory Visit
Admission: RE | Admit: 2017-12-15 | Discharge: 2017-12-15 | Disposition: A | Discharge status: Home / Self Care | Payer: Managed Care, Other (non HMO) | Source: Ambulatory Visit | Attending: Family Medicine | Admitting: Family Medicine

## 2017-12-15 DIAGNOSIS — Z1231 Encounter for screening mammogram for malignant neoplasm of breast: Secondary | ICD-10-CM | POA: Diagnosis present

## 2018-11-16 ENCOUNTER — Other Ambulatory Visit: Payer: Self-pay | Admitting: Family Medicine

## 2018-11-16 DIAGNOSIS — Z1231 Encounter for screening mammogram for malignant neoplasm of breast: Secondary | ICD-10-CM

## 2018-12-21 ENCOUNTER — Ambulatory Visit
Admission: RE | Admit: 2018-12-21 | Discharge: 2018-12-21 | Disposition: A | Discharge status: Home / Self Care | Payer: Managed Care, Other (non HMO) | Source: Ambulatory Visit | Attending: Family Medicine | Admitting: Family Medicine

## 2018-12-21 DIAGNOSIS — Z1231 Encounter for screening mammogram for malignant neoplasm of breast: Secondary | ICD-10-CM | POA: Diagnosis present

## 2019-09-08 ENCOUNTER — Other Ambulatory Visit: Payer: Self-pay | Admitting: Obstetrics and Gynecology

## 2019-09-08 DIAGNOSIS — Z1231 Encounter for screening mammogram for malignant neoplasm of breast: Secondary | ICD-10-CM

## 2019-12-27 ENCOUNTER — Other Ambulatory Visit: Payer: Self-pay

## 2019-12-27 ENCOUNTER — Ambulatory Visit
Admission: RE | Admit: 2019-12-27 | Discharge: 2019-12-27 | Disposition: A | Discharge status: Home / Self Care | Payer: Managed Care, Other (non HMO) | Source: Ambulatory Visit | Attending: Obstetrics and Gynecology | Admitting: Obstetrics and Gynecology

## 2019-12-27 DIAGNOSIS — Z1231 Encounter for screening mammogram for malignant neoplasm of breast: Secondary | ICD-10-CM | POA: Diagnosis not present

## 2020-09-07 ENCOUNTER — Other Ambulatory Visit: Payer: Self-pay | Admitting: Obstetrics and Gynecology

## 2020-09-07 DIAGNOSIS — Z1231 Encounter for screening mammogram for malignant neoplasm of breast: Secondary | ICD-10-CM

## 2020-12-27 ENCOUNTER — Ambulatory Visit
Admission: RE | Admit: 2020-12-27 | Discharge: 2020-12-27 | Disposition: A | Discharge status: Home / Self Care | Payer: Managed Care, Other (non HMO) | Source: Ambulatory Visit | Attending: Obstetrics and Gynecology | Admitting: Obstetrics and Gynecology

## 2020-12-27 ENCOUNTER — Other Ambulatory Visit: Payer: Self-pay

## 2020-12-27 DIAGNOSIS — Z1231 Encounter for screening mammogram for malignant neoplasm of breast: Secondary | ICD-10-CM

## 2022-03-13 DIAGNOSIS — E1165 Type 2 diabetes mellitus with hyperglycemia: Secondary | ICD-10-CM | POA: Diagnosis not present

## 2022-03-13 DIAGNOSIS — E669 Obesity, unspecified: Secondary | ICD-10-CM | POA: Diagnosis not present

## 2022-03-13 DIAGNOSIS — E1159 Type 2 diabetes mellitus with other circulatory complications: Secondary | ICD-10-CM | POA: Diagnosis not present

## 2022-03-13 DIAGNOSIS — E1169 Type 2 diabetes mellitus with other specified complication: Secondary | ICD-10-CM | POA: Diagnosis not present

## 2022-03-14 DIAGNOSIS — E1169 Type 2 diabetes mellitus with other specified complication: Secondary | ICD-10-CM | POA: Diagnosis not present

## 2022-03-14 DIAGNOSIS — E1165 Type 2 diabetes mellitus with hyperglycemia: Secondary | ICD-10-CM | POA: Diagnosis not present

## 2022-03-14 DIAGNOSIS — E669 Obesity, unspecified: Secondary | ICD-10-CM | POA: Diagnosis not present

## 2022-04-18 DIAGNOSIS — I152 Hypertension secondary to endocrine disorders: Secondary | ICD-10-CM | POA: Diagnosis not present

## 2022-04-18 DIAGNOSIS — E1169 Type 2 diabetes mellitus with other specified complication: Secondary | ICD-10-CM | POA: Diagnosis not present

## 2022-04-18 DIAGNOSIS — E1159 Type 2 diabetes mellitus with other circulatory complications: Secondary | ICD-10-CM | POA: Diagnosis not present

## 2022-04-18 DIAGNOSIS — E1165 Type 2 diabetes mellitus with hyperglycemia: Secondary | ICD-10-CM | POA: Diagnosis not present

## 2022-04-24 DIAGNOSIS — H5213 Myopia, bilateral: Secondary | ICD-10-CM | POA: Diagnosis not present

## 2022-06-13 DIAGNOSIS — E1169 Type 2 diabetes mellitus with other specified complication: Secondary | ICD-10-CM | POA: Diagnosis not present

## 2022-06-13 DIAGNOSIS — E785 Hyperlipidemia, unspecified: Secondary | ICD-10-CM | POA: Diagnosis not present

## 2022-06-17 ENCOUNTER — Other Ambulatory Visit: Payer: Self-pay | Admitting: Family Medicine

## 2022-06-17 DIAGNOSIS — E1169 Type 2 diabetes mellitus with other specified complication: Secondary | ICD-10-CM | POA: Diagnosis not present

## 2022-06-17 DIAGNOSIS — E1159 Type 2 diabetes mellitus with other circulatory complications: Secondary | ICD-10-CM | POA: Diagnosis not present

## 2022-06-17 DIAGNOSIS — Z1231 Encounter for screening mammogram for malignant neoplasm of breast: Secondary | ICD-10-CM

## 2022-06-17 DIAGNOSIS — Z0000 Encounter for general adult medical examination without abnormal findings: Secondary | ICD-10-CM | POA: Diagnosis not present

## 2022-06-18 DIAGNOSIS — H401132 Primary open-angle glaucoma, bilateral, moderate stage: Secondary | ICD-10-CM | POA: Diagnosis not present

## 2022-06-18 DIAGNOSIS — E119 Type 2 diabetes mellitus without complications: Secondary | ICD-10-CM | POA: Diagnosis not present

## 2022-06-18 IMAGING — MG MM DIGITAL SCREENING BILAT W/ TOMO AND CAD
8 series · 8 of 24 positions shown · non-contrast
Comparison: Previous exam(s).

CLINICAL DATA: Screening.

EXAM:
DIGITAL SCREENING BILATERAL MAMMOGRAM WITH TOMOSYNTHESIS AND CAD
TECHNIQUE: Bilateral screening digital craniocaudal and mediolateral oblique
mammograms were obtained. Bilateral screening digital breast
tomosynthesis was performed. The images were evaluated with
computer-aided detection.

[R MLO synth-2D]
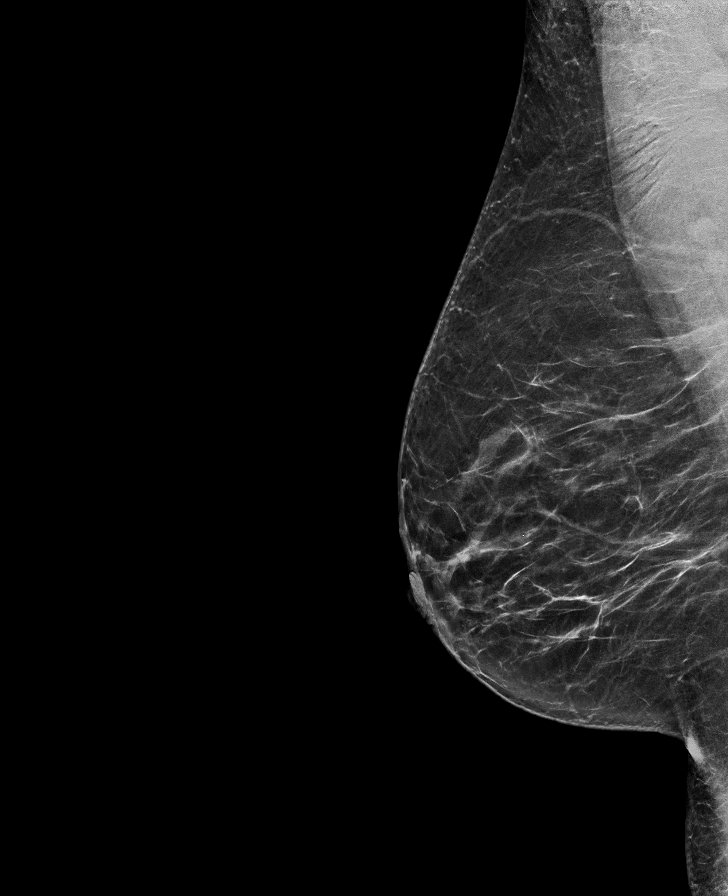

[L CC synth-2D]
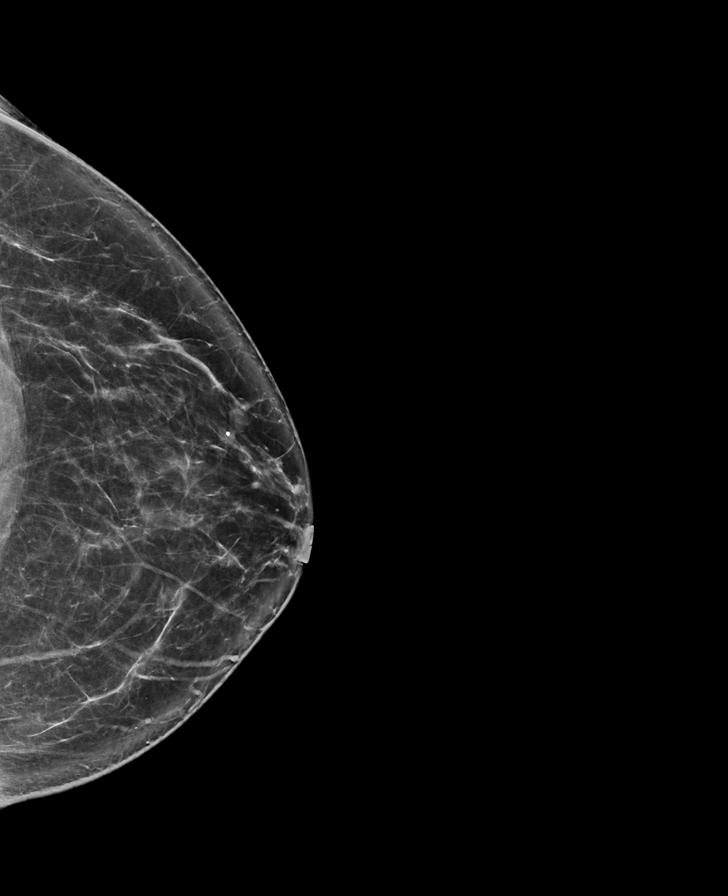

[L MLO synth-2D]
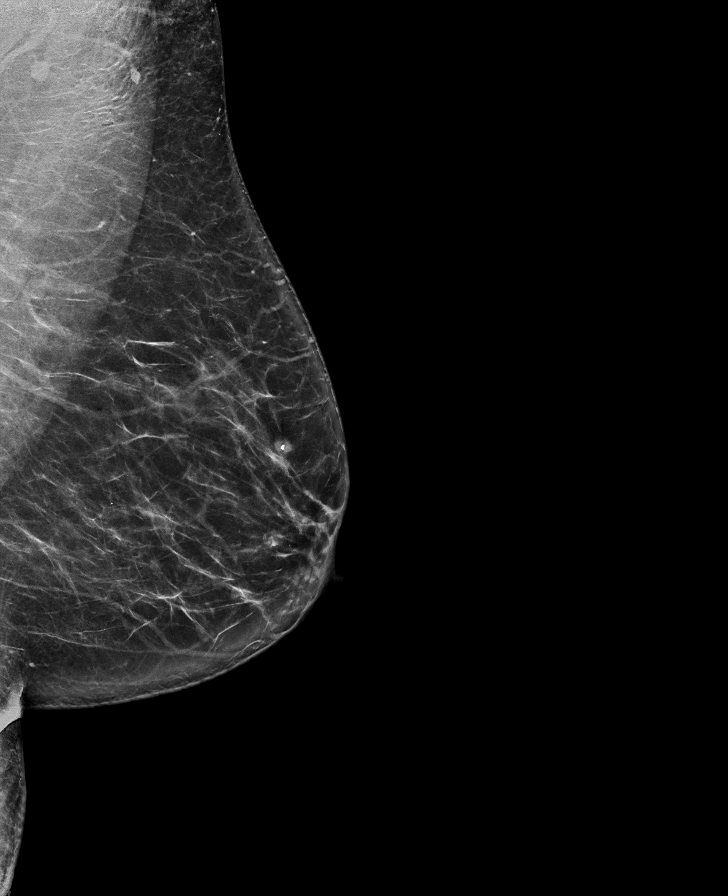

[R CC synth-2D]
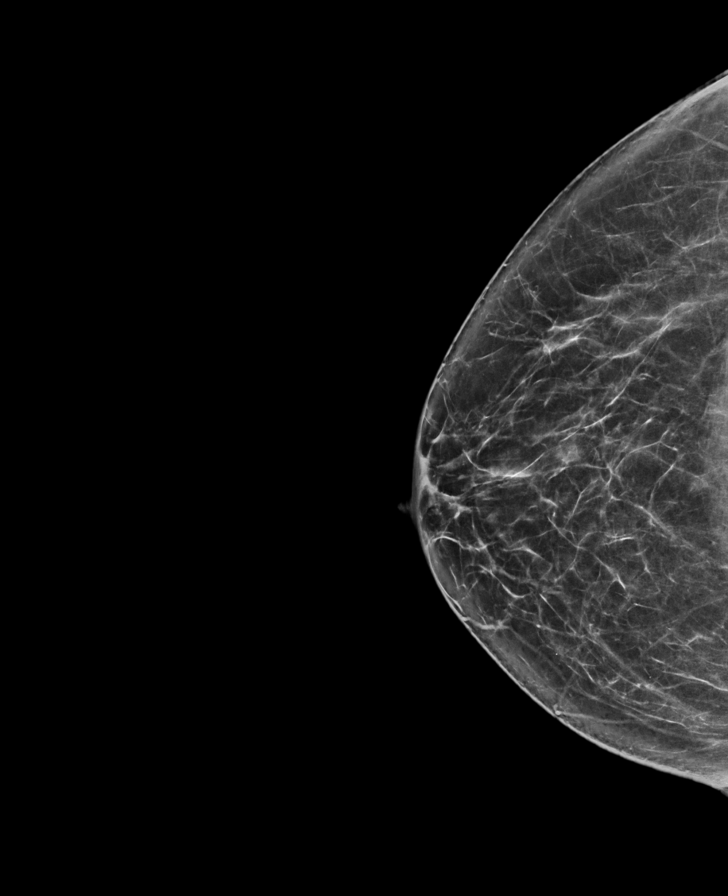

[L MLO tomo · tomo slice 40/79.0]
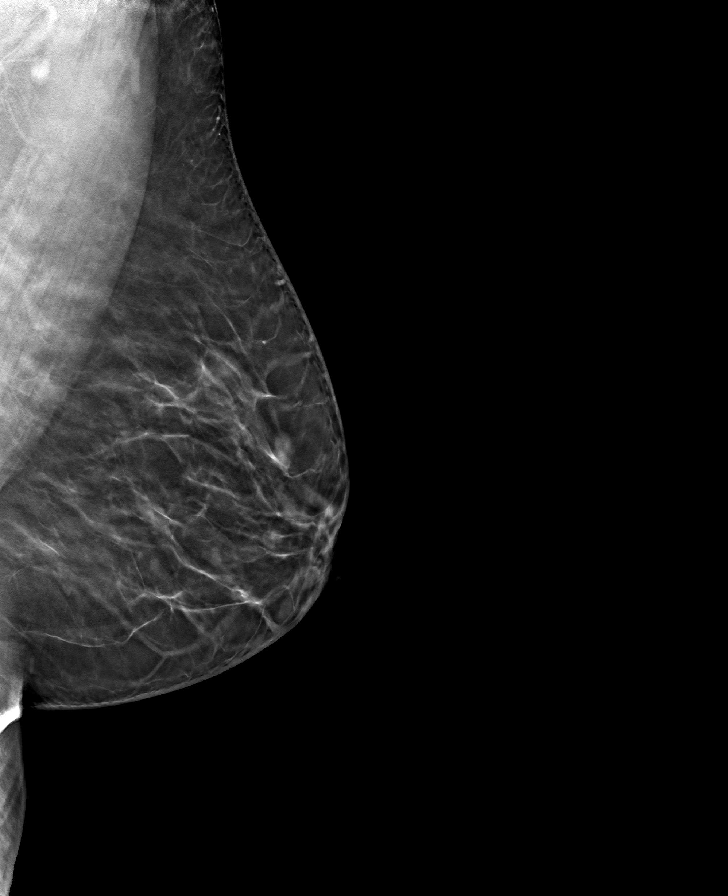

[R CC tomo · tomo slice 35/70.0]
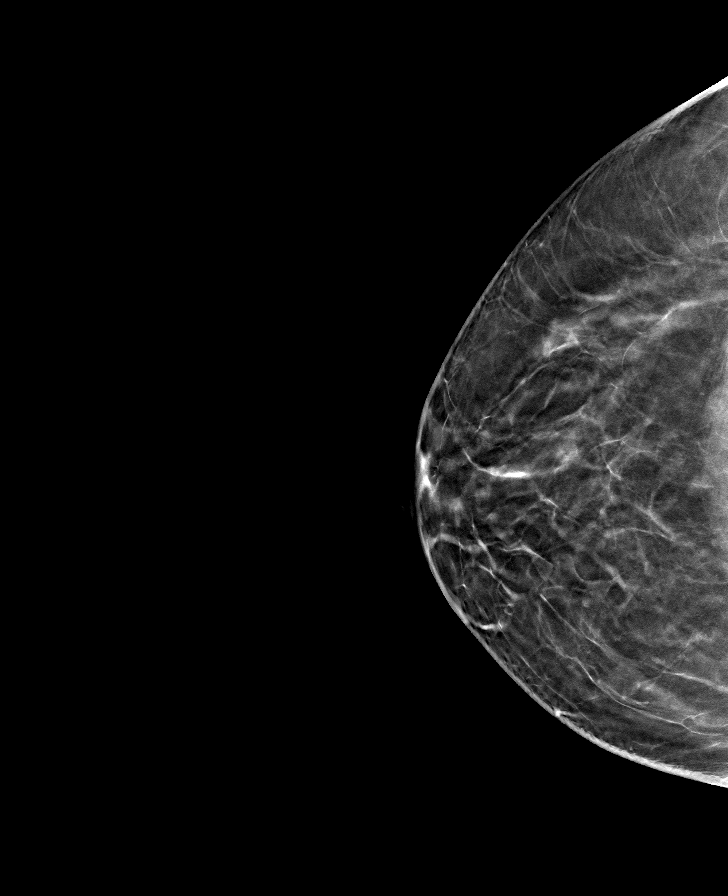

[L CC tomo · tomo slice 38/75.0]
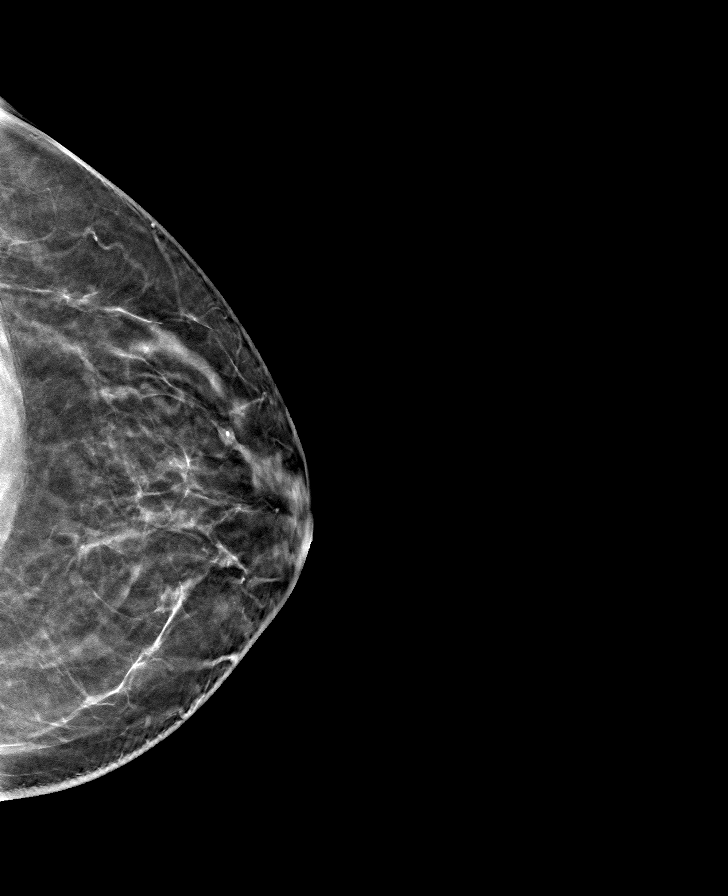

[R MLO tomo · tomo slice 35/69.0]
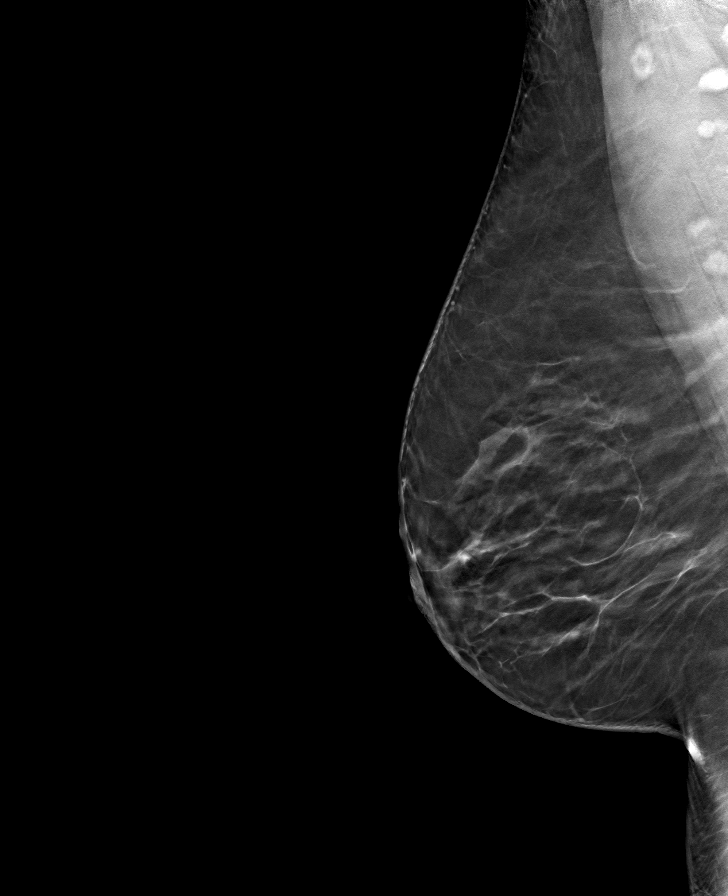

[8 of 24 positions shown; findings below may reference images not displayed]

ACR Breast Density Category b: There are scattered areas of
fibroglandular density.
FINDINGS: There are no findings suspicious for malignancy.
IMPRESSION: No mammographic evidence of malignancy. A result letter of this
screening mammogram will be mailed directly to the patient.

RECOMMENDATION:
Screening mammogram in one year. (Code:51-O-LD2)

BI-RADS CATEGORY  1: Negative.

## 2022-07-02 ENCOUNTER — Ambulatory Visit
Admission: RE | Admit: 2022-07-02 | Discharge: 2022-07-02 | Disposition: A | Discharge status: Home / Self Care | Payer: Medicaid Other | Source: Ambulatory Visit | Attending: Family Medicine | Admitting: Family Medicine

## 2022-07-02 DIAGNOSIS — Z1231 Encounter for screening mammogram for malignant neoplasm of breast: Secondary | ICD-10-CM | POA: Diagnosis not present

## 2022-07-16 DIAGNOSIS — E119 Type 2 diabetes mellitus without complications: Secondary | ICD-10-CM | POA: Diagnosis not present

## 2022-07-16 DIAGNOSIS — H401132 Primary open-angle glaucoma, bilateral, moderate stage: Secondary | ICD-10-CM | POA: Diagnosis not present

## 2022-10-18 DIAGNOSIS — E119 Type 2 diabetes mellitus without complications: Secondary | ICD-10-CM | POA: Diagnosis not present

## 2022-10-18 DIAGNOSIS — H401132 Primary open-angle glaucoma, bilateral, moderate stage: Secondary | ICD-10-CM | POA: Diagnosis not present

## 2023-06-16 ENCOUNTER — Other Ambulatory Visit: Payer: Self-pay | Admitting: Family Medicine

## 2023-06-16 DIAGNOSIS — Z1231 Encounter for screening mammogram for malignant neoplasm of breast: Secondary | ICD-10-CM

## 2023-07-03 ENCOUNTER — Ambulatory Visit
Admission: RE | Admit: 2023-07-03 | Discharge: 2023-07-03 | Disposition: A | Discharge status: Home / Self Care | Source: Ambulatory Visit | Attending: Family Medicine | Admitting: Family Medicine

## 2023-07-03 DIAGNOSIS — Z1231 Encounter for screening mammogram for malignant neoplasm of breast: Secondary | ICD-10-CM | POA: Insufficient documentation

## 2024-02-13 ENCOUNTER — Encounter
